# Patient Record
Sex: Male | Born: 1975 | Race: Black or African American | Hispanic: No | Marital: Married | Smoking: Current every day smoker
Health system: Southern US, Community
[De-identification: ages and names within clinical notes are randomized; demographics above are authoritative.]

## PROBLEM LIST (undated history)

## (undated) DIAGNOSIS — I10 Essential (primary) hypertension: Secondary | ICD-10-CM

---

## 2019-08-27 ENCOUNTER — Emergency Department (HOSPITAL_BASED_OUTPATIENT_CLINIC_OR_DEPARTMENT_OTHER): Payer: BC Managed Care – PPO

## 2019-08-27 ENCOUNTER — Emergency Department (HOSPITAL_BASED_OUTPATIENT_CLINIC_OR_DEPARTMENT_OTHER)
Admission: EM | Admit: 2019-08-27 | Discharge: 2019-08-27 | Disposition: A | Discharge status: Home / Self Care | Payer: BC Managed Care – PPO | Attending: Emergency Medicine | Admitting: Emergency Medicine

## 2019-08-27 ENCOUNTER — Encounter (HOSPITAL_BASED_OUTPATIENT_CLINIC_OR_DEPARTMENT_OTHER): Payer: Self-pay | Admitting: *Deleted

## 2019-08-27 ENCOUNTER — Other Ambulatory Visit: Payer: Self-pay

## 2019-08-27 DIAGNOSIS — Z20822 Contact with and (suspected) exposure to covid-19: Secondary | ICD-10-CM | POA: Insufficient documentation

## 2019-08-27 DIAGNOSIS — B349 Viral infection, unspecified: Secondary | ICD-10-CM | POA: Diagnosis not present

## 2019-08-27 DIAGNOSIS — Z79899 Other long term (current) drug therapy: Secondary | ICD-10-CM | POA: Diagnosis not present

## 2019-08-27 DIAGNOSIS — I10 Essential (primary) hypertension: Secondary | ICD-10-CM | POA: Diagnosis not present

## 2019-08-27 DIAGNOSIS — F17210 Nicotine dependence, cigarettes, uncomplicated: Secondary | ICD-10-CM | POA: Diagnosis not present

## 2019-08-27 DIAGNOSIS — R0602 Shortness of breath: Secondary | ICD-10-CM | POA: Diagnosis present

## 2019-08-27 HISTORY — DX: Essential (primary) hypertension: I10

## 2019-08-27 LAB — SARS CORONAVIRUS 2 BY RT PCR (HOSPITAL ORDER, PERFORMED IN ~~LOC~~ HOSPITAL LAB): SARS Coronavirus 2: NEGATIVE

## 2019-08-27 NOTE — ED Notes (Signed)
Ambulated in the hallway.  O2 sat between 94-96%; HR =104=111.  Denies SOB during ambulation.

## 2019-08-27 NOTE — ED Provider Notes (Signed)
[facility] EMERGENCY DEPARTMENT Provider Note   CSN: [id number] Arrival date & time: 08/27/19  1024     History Chief Complaint  Patient presents with  . Shortness of Breath    Ethan Potts is a 44 y.o. male.  HPI     Tuesday had flu like symptoms, nasal congestion and shortness of breath today Body aches, fever, chills, headache, sore throat No loss of taste or smell No n/v/d Shortness of breath started today, mild No chest pain Fever and other symptoms have improved  No known sick contacts Not received vaccine   Past Medical History:  Diagnosis Date  . Hypertension     There are no problems to display for this patient.   History reviewed. No pertinent surgical history.     No family history on file.  Social History   Tobacco Use  . Smoking status: Current Every Day Smoker    Packs/day: 0.50  . Smokeless tobacco: Never Used  Substance Use Topics  . Alcohol use: Yes    Comment: occasionally  . Drug use: Yes    Types: Marijuana    Comment: last week    Home Medications Prior to Admission medications   Medication Sig Start Date End Date Taking? Authorizing Provider  amLODipine (NORVASC) 5 MG tablet Take 5 mg by mouth daily.   Yes [provider]  losartan (COZAAR) 50 MG tablet Take 50 mg by mouth daily.   Yes [provider]    Allergies    Patient has no known allergies.  Review of Systems   Review of Systems  Constitutional: Positive for fatigue and fever (improved).  HENT: Positive for congestion and sore throat.   Respiratory: Positive for cough and shortness of breath.   Cardiovascular: Negative for chest pain.  Gastrointestinal: Negative for abdominal pain, diarrhea, nausea and vomiting.  Musculoskeletal: Positive for arthralgias and myalgias.  Neurological: Positive for headaches. Negative for light-headedness.    Physical Exam Updated Vital Signs BP (!) 137/91   Pulse 84   Temp 97.8 F (36.6 C)  (Oral)   Resp 20   Ht 5\' 10"  (1.778 m)   Wt 98.5 kg   SpO2 97%   BMI 31.16 kg/m   Physical Exam Vitals and nursing note reviewed.  Constitutional:      General: He is not in acute distress.    Appearance: He is well-developed. He is not diaphoretic.  HENT:     Head: Normocephalic and atraumatic.  Eyes:     Conjunctiva/sclera: Conjunctivae normal.  Cardiovascular:     Rate and Rhythm: Normal rate and regular rhythm.     Heart sounds: Normal heart sounds. No murmur. No friction rub. No gallop.   Pulmonary:     Effort: Pulmonary effort is normal. No respiratory distress.     Breath sounds: Normal breath sounds. No wheezing or rales.  Abdominal:     General: There is no distension.     Palpations: Abdomen is soft.     Tenderness: There is no abdominal tenderness. There is no guarding.  Musculoskeletal:     Cervical back: Normal range of motion.  Skin:    General: Skin is warm and dry.  Neurological:     Mental Status: He is alert and oriented to person, place, and time.     ED Results / Procedures / Treatments   Labs (all labs ordered are listed, but only abnormal results are displayed) Labs Reviewed  SARS CORONAVIRUS 2 BY RT PCR [facility]  ORDER, PERFORMED IN [facility])    EKG None  Radiology DG Chest Portable 1 View  Result Date: 08/27/2019 CLINICAL DATA:  Cough and shortness of breath.  Fever. EXAM: PORTABLE CHEST 1 VIEW COMPARISON:  None. FINDINGS: Lungs are clear. Heart size and pulmonary vascularity are normal. No adenopathy. No bone lesions. IMPRESSION: Lungs clear.  Cardiac silhouette within normal limits. Electronically Signed   By: Lowella Grip III M.D.   On: 08/27/2019 11:33    Procedures Procedures (including critical care time)  Medications Ordered in ED Medications - No data to display  ED Course  I have reviewed the triage vital signs and the nursing notes.  Pertinent labs & imaging results that were available during my care  of the patient were reviewed by me and considered in my medical decision making (see chart for details).    MDM Rules/Calculators/A&P                      44yo male with history of hypertension presents with concern for cough, sore throat, fever days ago now improved, improved body aches, and mild shortness of breath beginning today.  CXR without signs of pneumonia.  Clinical concern for COVID 19 infection. Oxygenation normal with ambulation.  COVID testing pending. Recommend quarantine pending results. No hx to suggest significant anemia or significant electrolyte abnormality. Low suspicion for PE/ACS by history.  Recommend continued supportive care. Patient discharged in stable condition with understanding of reasons to return.    Ethan Potts was evaluated in Emergency Department on 08/27/2019 for the symptoms described in the history of present illness. He was evaluated in the context of the global COVID-19 pandemic, which necessitated consideration that the patient might be at risk for infection with the SARS-CoV-2 virus that causes COVID-19. Institutional protocols and algorithms that pertain to the evaluation of patients at risk for COVID-19 are in a state of rapid change based on information released by regulatory bodies including the CDC and federal and state organizations. These policies and algorithms were followed during the patient's care in the ED.     Final Clinical Impression(s) / ED Diagnoses Final diagnoses:  Suspected COVID-19 virus infection  Viral syndrome    Rx / DC Orders ED Discharge Orders    None       Gareth Morgan, MD 08/27/19 1224

## 2019-08-27 NOTE — ED Triage Notes (Signed)
Coughing since Tuesday;  fever 100.6 on Wednesday.

## 2021-06-15 ENCOUNTER — Emergency Department (HOSPITAL_BASED_OUTPATIENT_CLINIC_OR_DEPARTMENT_OTHER)
Admission: EM | Admit: 2021-06-15 | Discharge: 2021-06-16 | Disposition: A | Discharge status: Home / Self Care | Payer: BC Managed Care – PPO | Attending: Emergency Medicine | Admitting: Emergency Medicine

## 2021-06-15 ENCOUNTER — Other Ambulatory Visit: Payer: Self-pay

## 2021-06-15 ENCOUNTER — Encounter (HOSPITAL_BASED_OUTPATIENT_CLINIC_OR_DEPARTMENT_OTHER): Payer: Self-pay | Admitting: Emergency Medicine

## 2021-06-15 DIAGNOSIS — R519 Headache, unspecified: Secondary | ICD-10-CM | POA: Insufficient documentation

## 2021-06-15 DIAGNOSIS — R030 Elevated blood-pressure reading, without diagnosis of hypertension: Secondary | ICD-10-CM | POA: Insufficient documentation

## 2021-06-15 DIAGNOSIS — R61 Generalized hyperhidrosis: Secondary | ICD-10-CM | POA: Insufficient documentation

## 2021-06-15 MED ORDER — SODIUM CHLORIDE 0.9 % IV BOLUS
500.0000 mL | Freq: Once | INTRAVENOUS | Status: AC
  Administered 2021-06-16: 500 mL via INTRAVENOUS

## 2021-06-15 MED ORDER — METOCLOPRAMIDE HCL 5 MG/ML IJ SOLN
10.0000 mg | Freq: Once | INTRAMUSCULAR | Status: AC
  Administered 2021-06-16: 10 mg via INTRAVENOUS
  Filled 2021-06-15: qty 2

## 2021-06-15 NOTE — ED Notes (Signed)
Contact radiology regarding stat CT order per MD.  ?

## 2021-06-15 NOTE — ED Triage Notes (Signed)
Pt has been having headaches off and on for the past couple of weeks  Pt states tonight the headache is different and is severe in nature  Pt states it hurts to open his eyes  Pt states the pain is on the left side of his head from behind his left eye to the top of his head   Denies nausea or vomiting   Pt took excedrin migraine prior to coming in  ?

## 2021-06-15 NOTE — ED Provider Notes (Signed)
?[facility] EMERGENCY DEPARTMENT ?Provider Note ? ? ?CSN: [id number] ?Arrival date & time: 06/15/21  2305 ? ?  ? ?History ? ?Chief Complaint  ?Patient presents with  ? Headache  ? ? ?Kaine Gillyard is a 46 y.o. male. ? ?The history is provided by the patient and the spouse.  ?Headache ?Jahkari Manis is a 46 y.o. male who presents to the Emergency Department complaining of HA.  He presents emergency department accompanied by his wife for evaluation of sudden onset headache that occurred at 10:45 PM.  This occurred at rest.  Is located throughout the entire left side of his head.  He has experienced mild headaches that have been coming and going for the last 2 weeks.  Before 2 weeks ago no prior headaches.  He has no known medical problems and takes no medications.  He does use tobacco.  Occasional alcohol.  Occasional marijuana use.  No cocaine or additional drug use.  He does have family history of aneurysm in his uncle. ? ? ?Home Medications ?Prior to Admission medications   ?Medication Sig Start Date End Date Taking? Authorizing Provider  ?amLODipine (NORVASC) 5 MG tablet Take 1 tablet (5 mg total) by mouth daily. 06/16/21 07/16/21 Yes Quintella Reichert, MD  ?losartan (COZAAR) 50 MG tablet Take 50 mg by mouth daily.    [provider]  ?   ? ?Allergies    ?Patient has no known allergies.   ? ?Review of Systems   ?Review of Systems  ?Neurological:  Positive for headaches.  ?All other systems reviewed and are negative. ? ?Physical Exam ?Updated Vital Signs ?BP (!) 152/79   Pulse 92   Temp 98.1 ?F (36.7 ?C) (Oral)   Resp 17   Ht 5\' 10"  (1.778 m)   Wt 99.8 kg   SpO2 98%   BMI 31.57 kg/m?  ?Physical Exam ?Vitals and nursing note reviewed.  ?Constitutional:   ?   General: He is in acute distress.  ?   Appearance: He is well-developed. He is diaphoretic.  ?HENT:  ?   Head: Normocephalic and atraumatic.  ?Cardiovascular:  ?   Rate and Rhythm: Normal rate and regular rhythm.  ?   Heart sounds: No murmur  heard. ?Pulmonary:  ?   Effort: Pulmonary effort is normal. No respiratory distress.  ?   Breath sounds: Normal breath sounds.  ?Abdominal:  ?   Palpations: Abdomen is soft.  ?   Tenderness: There is no abdominal tenderness. There is no guarding or rebound.  ?Musculoskeletal:     ?   General: No tenderness.  ?Skin: ?   General: Skin is warm.  ?Neurological:  ?   Mental Status: He is alert and oriented to person, place, and time.  ?   Comments: Pupils equal round and reactive, EOMI.  No asymmetry of facial movements.  5 out of 5 strength in all 4 extremities with sensation to light touch intact in all 4 extremities.  ? ? ?ED Results / Procedures / Treatments   ?Labs ?(all labs ordered are listed, but only abnormal results are displayed) ?Labs Reviewed  ?BASIC METABOLIC PANEL - Abnormal; Notable for the following components:  ?    Result Value  ? Potassium 3.3 (*)   ? Glucose, Bld 166 (*)   ? All other components within normal limits  ?CBC WITH DIFFERENTIAL/PLATELET  ? ? ?EKG ?None ? ?Radiology ?CT Head Wo Contrast ? ?Result Date: 06/16/2021 ?CLINICAL DATA:  Initial evaluation for acute headache. EXAM: CT  HEAD WITHOUT CONTRAST TECHNIQUE: Contiguous axial images were obtained from the base of the skull through the vertex without intravenous contrast. RADIATION DOSE REDUCTION: This exam was performed according to the departmental dose-optimization program which includes automated exposure control, adjustment of the mA and/or kV according to patient size and/or use of iterative reconstruction technique. COMPARISON:  None available. FINDINGS: Brain: Cerebral volume within normal limits for patient age. No evidence for acute intracranial hemorrhage. No findings to suggest acute large vessel territory infarct. No mass lesion, midline shift, or mass effect. Ventricles are normal in size without evidence for hydrocephalus. No extra-axial fluid collection identified. Vascular: No hyperdense vessel identified. Skull: Scalp soft  tissues demonstrate no acute abnormality. Calvarium intact. Sinuses/Orbits: Globes and orbital soft tissues within normal limits. Scattered mucosal thickening noted within the ethmoidal air cells. Paranasal sinuses are otherwise clear. No mastoid effusion. IMPRESSION: Normal head CT. No acute intracranial abnormality identified. Electronically Signed   By: Jeannine Boga M.D.   On: 06/16/2021 00:25   ? ?Procedures ?Procedures  ? ? ?Medications Ordered in ED ?Medications  ?sodium chloride 0.9 % bolus 500 mL ( Intravenous Stopped 06/16/21 0129)  ?metoCLOPramide (REGLAN) injection 10 mg (10 mg Intravenous Given 06/16/21 0025)  ?dexamethasone (DECADRON) injection 10 mg (10 mg Intravenous Given 06/16/21 0219)  ? ? ?ED Course/ Medical Decision Making/ A&P ?  ?                        ?Medical Decision Making ?Amount and/or Complexity of Data Reviewed ?Labs: ordered. ?Radiology: ordered. ? ?Risk ?Prescription drug management. ? ? ?Patient here for evaluation of severe headache.  Patient distressed on ED arrival with severe pain, diaphoresis.  No focal neurologic deficits.  Initial concern for potential subarachnoid hemorrhage and patient transferred emergently to CT scanner for further imaging.  CT is negative for acute abnormality.  He was treated with medications for his headache with significant improvement in his headache.  Patient was very hypertensive on ED presentation.  His blood pressure did significantly improve after treatment of his headache.  He did have some residual hypertension after headache treatment.  Concerned that he does have some underlying hypertension.  Labs otherwise within normal limits.  Discussed with patient home care for headache.  Will treat with Decadron to prevent rebound headache.  Current clinical picture is not consistent with meningitis.  Subarachnoid hemorrhage has been ruled out based off of negative head CT and short duration of symptoms prior to ED presentation.  Doubt  hypertensive urgency as patient's blood pressure improved after treatment with traditional headache treatments.  Discussed with patient recommendation to initiate medication for his high blood pressure given persistent elevation in pressure on reassessment.  Discussed importance of PCP follow-up for ongoing blood pressure management as well as recommendation for neurology follow-up due to his atypical headaches.  Close return precautions discussed for recurrent or progressive symptoms. ? ? ? ? ? ? ? ?Final Clinical Impression(s) / ED Diagnoses ?Final diagnoses:  ?Bad headache  ?Elevated blood pressure reading  ? ? ?Rx / DC Orders ?ED Discharge Orders   ? ?      Ordered  ?  amLODipine (NORVASC) 5 MG tablet  Daily       ? 06/16/21 0209  ?  Ambulatory referral to Neurology       ?Comments: An appointment is requested in approximately: 4 weeks ? ?Re headache  ? 06/16/21 0211  ? ?  ?  ? ?  ? ? ?  ?  Quintella Reichert, MD ?06/16/21 [id number] ? ?

## 2021-06-16 ENCOUNTER — Emergency Department (HOSPITAL_BASED_OUTPATIENT_CLINIC_OR_DEPARTMENT_OTHER): Payer: BC Managed Care – PPO

## 2021-06-16 ENCOUNTER — Telehealth (HOSPITAL_BASED_OUTPATIENT_CLINIC_OR_DEPARTMENT_OTHER): Payer: Self-pay | Admitting: Emergency Medicine

## 2021-06-16 LAB — CBC WITH DIFFERENTIAL/PLATELET
Abs Immature Granulocytes: 0.02 10*3/uL (ref 0.00–0.07)
Basophils Absolute: 0.1 10*3/uL (ref 0.0–0.1)
Basophils Relative: 1 %
Eosinophils Absolute: 0.1 10*3/uL (ref 0.0–0.5)
Eosinophils Relative: 1 %
HCT: 44.5 % (ref 39.0–52.0)
Hemoglobin: 15 g/dL (ref 13.0–17.0)
Immature Granulocytes: 0 %
Lymphocytes Relative: 21 %
Lymphs Abs: 2 10*3/uL (ref 0.7–4.0)
MCH: 31.3 pg (ref 26.0–34.0)
MCHC: 33.7 g/dL (ref 30.0–36.0)
MCV: 92.9 fL (ref 80.0–100.0)
Monocytes Absolute: 0.5 10*3/uL (ref 0.1–1.0)
Monocytes Relative: 5 %
Neutro Abs: 6.9 10*3/uL (ref 1.7–7.7)
Neutrophils Relative %: 72 %
Platelets: 235 10*3/uL (ref 150–400)
RBC: 4.79 MIL/uL (ref 4.22–5.81)
RDW: 12.4 % (ref 11.5–15.5)
WBC: 9.5 10*3/uL (ref 4.0–10.5)
nRBC: 0 % (ref 0.0–0.2)

## 2021-06-16 LAB — BASIC METABOLIC PANEL
Anion gap: 8 (ref 5–15)
BUN: 7 mg/dL (ref 6–20)
CO2: 27 mmol/L (ref 22–32)
Calcium: 9.1 mg/dL (ref 8.9–10.3)
Chloride: 107 mmol/L (ref 98–111)
Creatinine, Ser: 1.05 mg/dL (ref 0.61–1.24)
GFR, Estimated: >60 mL/min (ref >60)
Glucose, Bld: 166 mg/dL — ABNORMAL HIGH (ref 70–99)
Potassium: 3.3 mmol/L — ABNORMAL LOW (ref 3.5–5.1)
Sodium: 142 mmol/L (ref 135–145)

## 2021-06-16 MED ORDER — AMLODIPINE BESYLATE 5 MG PO TABS: 5.0000 mg | ORAL_TABLET | Freq: Every day | ORAL | 0 refills | Status: AC

## 2021-06-16 MED ORDER — DEXAMETHASONE SODIUM PHOSPHATE 10 MG/ML IJ SOLN
10.0000 mg | Freq: Once | INTRAMUSCULAR | Status: AC
  Administered 2021-06-16: 10 mg via INTRAVENOUS
  Filled 2021-06-16: qty 1

## 2021-06-16 MED ORDER — AMLODIPINE BESYLATE 5 MG PO TABS: 5.0000 mg | ORAL_TABLET | Freq: Every day | ORAL | 0 refills | Status: DC

## 2021-06-16 NOTE — Telephone Encounter (Signed)
Call from patient's wife. Pharmacy did not receive prescription. Resent.  ?

## 2023-03-23 IMAGING — CT CT HEAD W/O CM
4 series · 17 of 47 positions shown, 19 images · non-contrast
Comparison: None available.

CLINICAL DATA: Initial evaluation for acute headache.



[Series 3: head wo · axial · 0.41mm/px · z∈[-148,-38]mm · 6 of 32 slices shown, 8 images]
[im 5/32  brain]
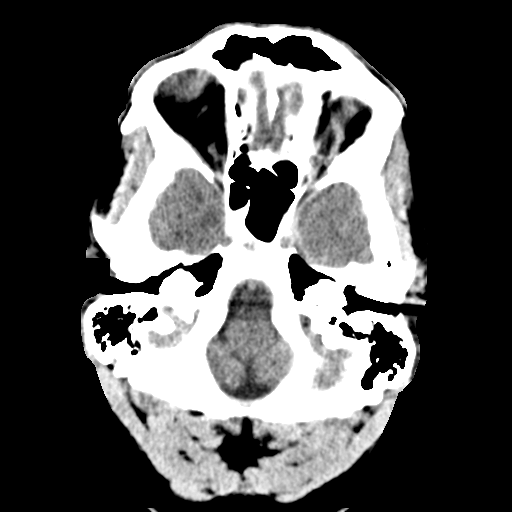
[im 5/32  bone]
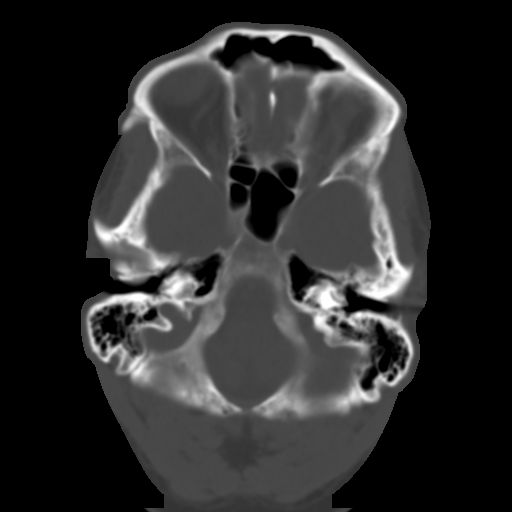
[im 9/32  brain]
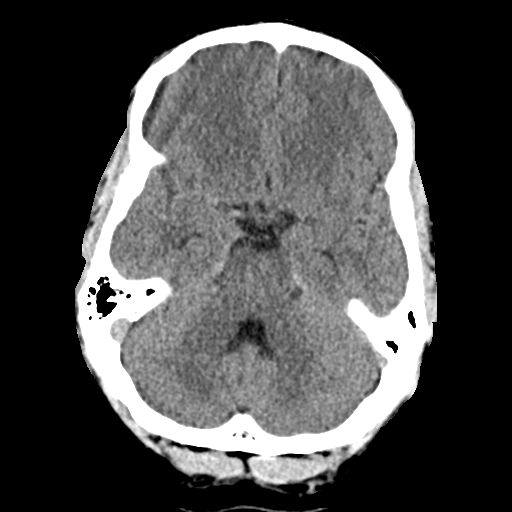
[im 14/32  brain]
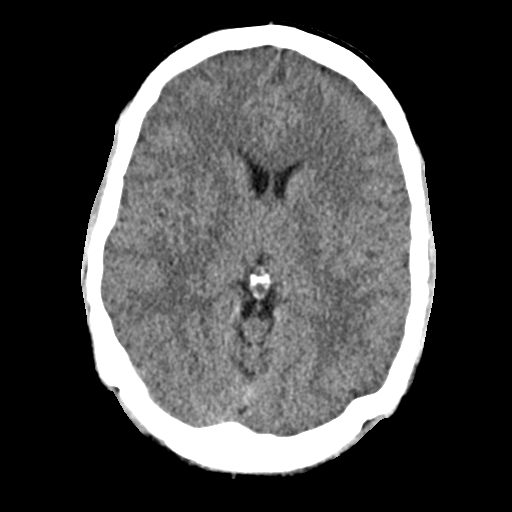
[im 18/32  brain]
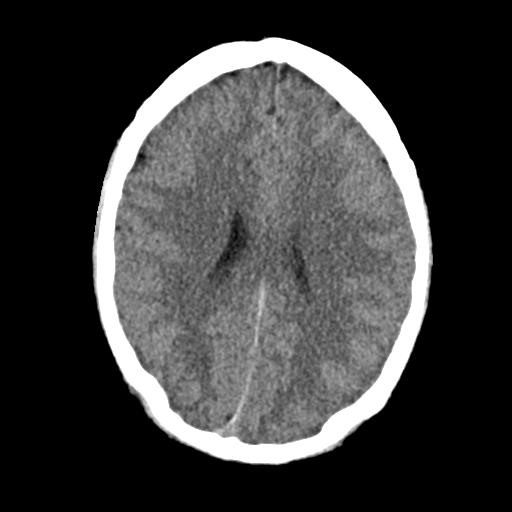
[im 23/32  brain]
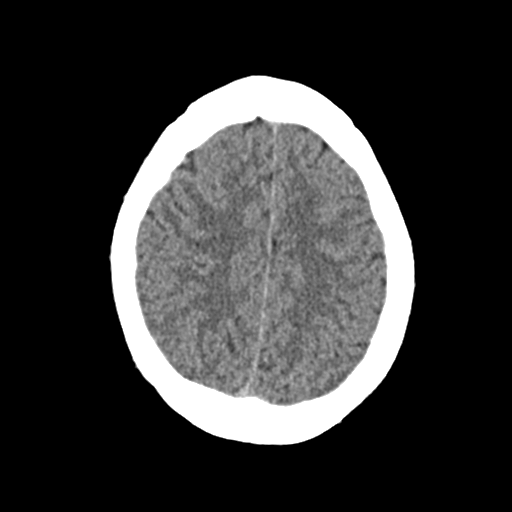
[im 23/32  bone]
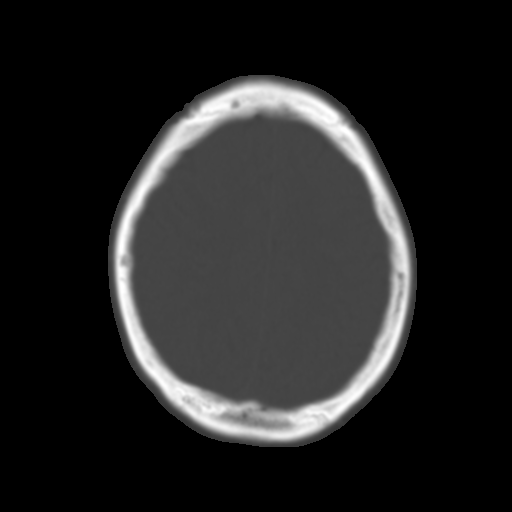
[im 27/32  brain]
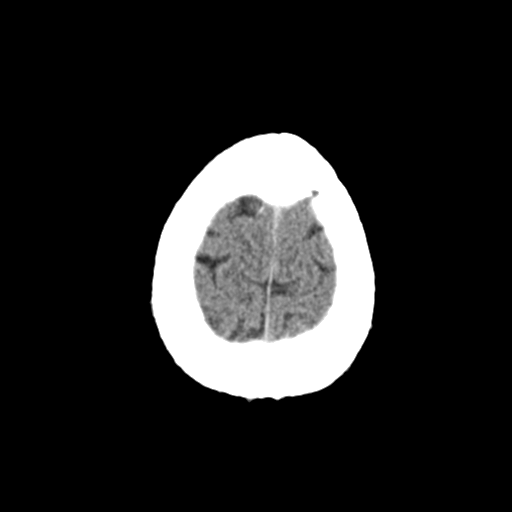

[Series 4: head bone · axial · 0.41mm/px · z∈[-154,-76]mm · 5 of 83 slices shown]
[im 8/83  bone]
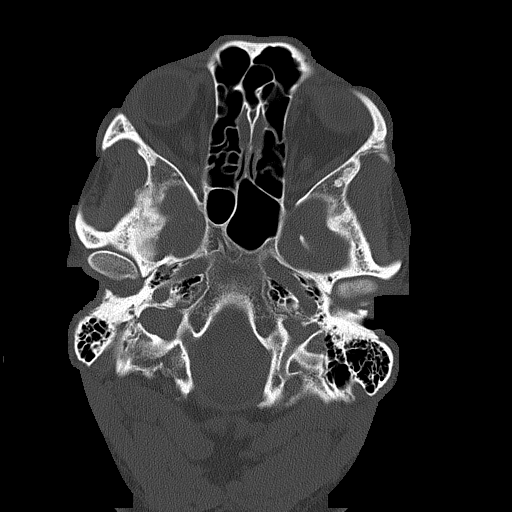
[im 16/83  bone]
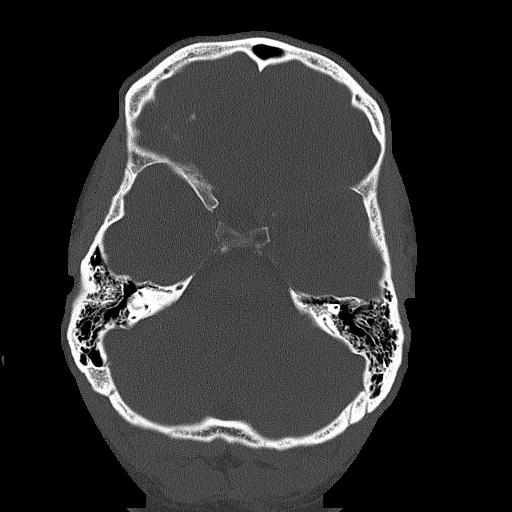
[im 28/83  bone]
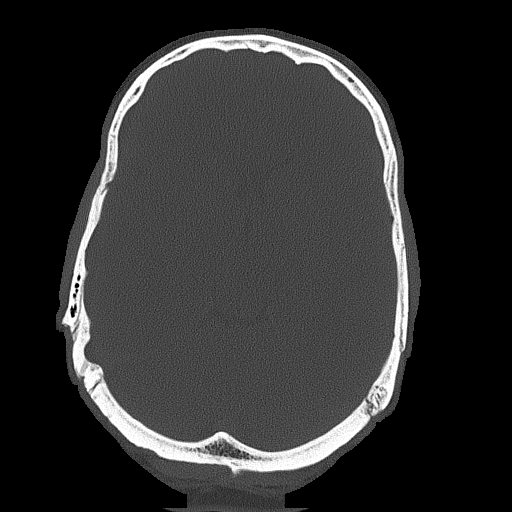
[im 36/83  bone]
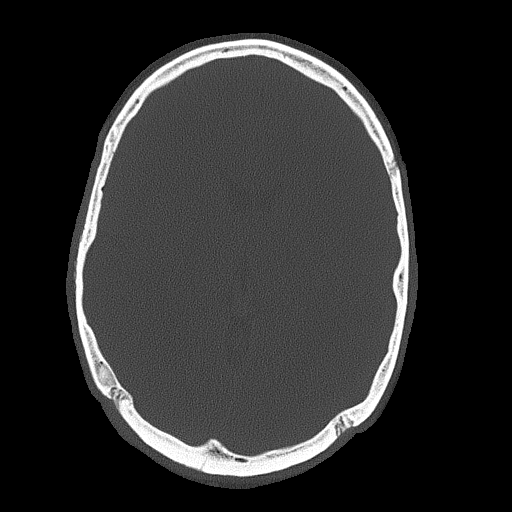
[im 47/83  bone]
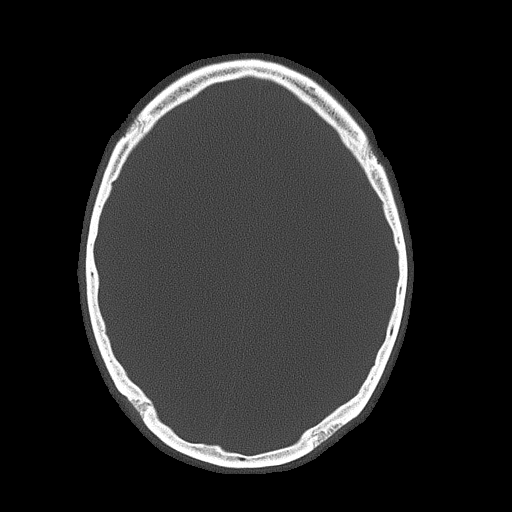

[Series 5: coronal soft · coronal · 0.34mm/px · 3 of 70 slices shown]
[im 24/70  brain]
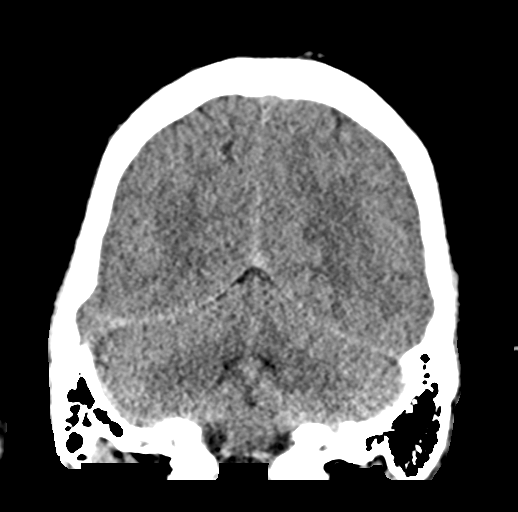
[im 31/70  brain]
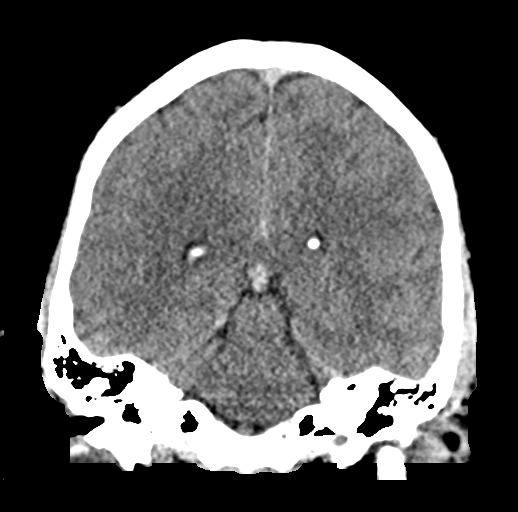
[im 39/70  brain]
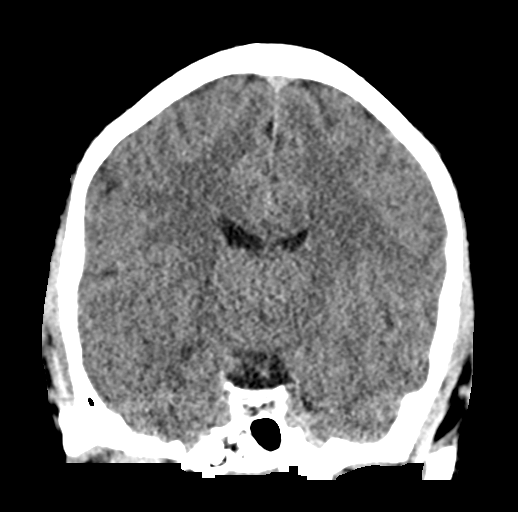

[Series 6: sag soft · sagittal · 0.35mm/px · 3 of 55 slices shown]
[im 19/55  brain]
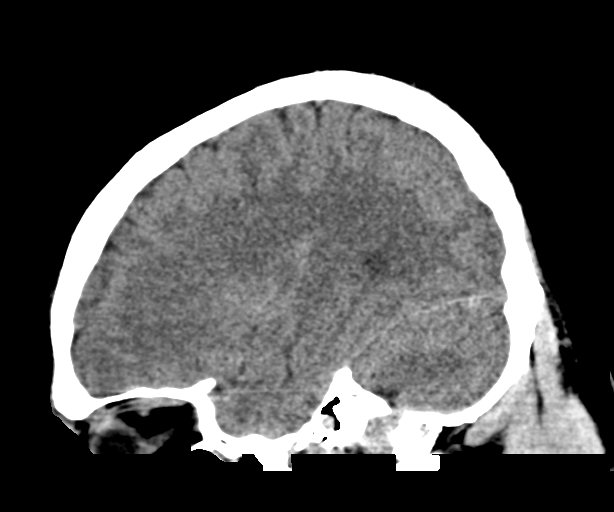
[im 28/55  brain]
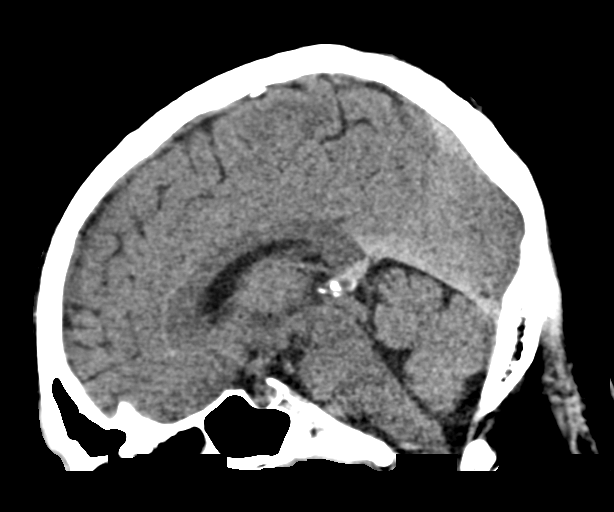
[im 37/55  brain]
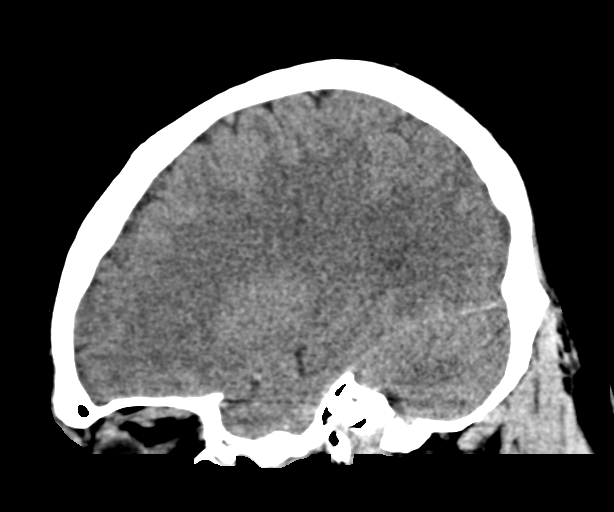

[17 of 47 positions shown; findings below may reference images not displayed]

FINDINGS: Brain: Cerebral volume within normal limits for patient age.

No evidence for acute intracranial hemorrhage. No findings to
suggest acute large vessel territory infarct. No mass lesion,
midline shift, or mass effect. Ventricles are normal in size without
evidence for hydrocephalus. No extra-axial fluid collection
identified.

Vascular: No hyperdense vessel identified.

Skull: Scalp soft tissues demonstrate no acute abnormality.
Calvarium intact.

Sinuses/Orbits: Globes and orbital soft tissues within normal
limits.

Scattered mucosal thickening noted within the ethmoidal air cells.
Paranasal sinuses are otherwise clear. No mastoid effusion.
IMPRESSION: Normal head CT. No acute intracranial abnormality identified.
